# Patient Record
Sex: Male | Born: 1960 | Race: White | Hispanic: No | Marital: Married | Smoking: Former smoker
Health system: Southern US, Community
[De-identification: ages and names within clinical notes are randomized; demographics above are authoritative.]

## PROBLEM LIST (undated history)

## (undated) DIAGNOSIS — N200 Calculus of kidney: Secondary | ICD-10-CM

## (undated) DIAGNOSIS — C801 Malignant (primary) neoplasm, unspecified: Secondary | ICD-10-CM

---

## 2011-03-31 ENCOUNTER — Emergency Department: Payer: Self-pay | Admitting: Emergency Medicine

## 2011-04-10 ENCOUNTER — Ambulatory Visit: Payer: Self-pay | Admitting: Urology

## 2012-02-08 ENCOUNTER — Emergency Department: Payer: Self-pay | Admitting: Emergency Medicine

## 2012-02-08 LAB — URINALYSIS, COMPLETE
Bacteria: NONE SEEN
Bilirubin,UR: NEGATIVE
Leukocyte Esterase: NEGATIVE
Nitrite: NEGATIVE
Ph: 7 (ref 4.5–8.0)
Protein: 30
RBC,UR: 1389 /HPF (ref 0–5)

## 2012-02-08 LAB — CBC
HCT: 45.6 % (ref 40.0–52.0)
HGB: 16 g/dL (ref 13.0–18.0)
MCH: 31.8 pg (ref 26.0–34.0)
MCHC: 35.1 g/dL (ref 32.0–36.0)
MCV: 91 fL (ref 80–100)
Platelet: 244 10*3/uL (ref 150–440)
RDW: 13.9 % (ref 11.5–14.5)
WBC: 10 10*3/uL (ref 3.8–10.6)

## 2012-02-08 LAB — COMPREHENSIVE METABOLIC PANEL
Alkaline Phosphatase: 61 U/L (ref 50–136)
Anion Gap: 8 (ref 7–16)
BUN: 16 mg/dL (ref 7–18)
Calcium, Total: 8.7 mg/dL (ref 8.5–10.1)
Co2: 27 mmol/L (ref 21–32)
Creatinine: 1.13 mg/dL (ref 0.60–1.30)
EGFR (African American): >60
SGOT(AST): 26 U/L (ref 15–37)
SGPT (ALT): 36 U/L (ref 12–78)
Total Protein: 7.1 g/dL (ref 6.4–8.2)

## 2012-03-01 ENCOUNTER — Ambulatory Visit: Payer: Self-pay | Admitting: Urology

## 2012-06-23 ENCOUNTER — Ambulatory Visit: Payer: Self-pay | Admitting: Urology

## 2013-05-23 ENCOUNTER — Ambulatory Visit: Payer: Self-pay | Admitting: Family Medicine

## 2015-02-09 ENCOUNTER — Ambulatory Visit
Admission: RE | Admit: 2015-02-09 | Discharge: 2015-02-09 | Disposition: A | Discharge status: Home / Self Care | Payer: Disability Insurance | Source: Ambulatory Visit | Attending: Physical Medicine and Rehabilitation | Admitting: Physical Medicine and Rehabilitation

## 2015-02-09 ENCOUNTER — Other Ambulatory Visit: Payer: Self-pay | Admitting: Physical Medicine and Rehabilitation

## 2015-02-09 DIAGNOSIS — M549 Dorsalgia, unspecified: Secondary | ICD-10-CM

## 2015-02-09 DIAGNOSIS — M545 Low back pain: Secondary | ICD-10-CM | POA: Diagnosis not present

## 2015-04-05 ENCOUNTER — Emergency Department: Payer: BC Managed Care – PPO

## 2015-04-05 ENCOUNTER — Emergency Department
Admission: EM | Admit: 2015-04-05 | Discharge: 2015-04-05 | Disposition: A | Discharge status: Home / Self Care | Payer: BC Managed Care – PPO | Attending: Emergency Medicine | Admitting: Emergency Medicine

## 2015-04-05 DIAGNOSIS — N201 Calculus of ureter: Secondary | ICD-10-CM

## 2015-04-05 DIAGNOSIS — R109 Unspecified abdominal pain: Secondary | ICD-10-CM | POA: Diagnosis present

## 2015-04-05 DIAGNOSIS — Z87891 Personal history of nicotine dependence: Secondary | ICD-10-CM | POA: Insufficient documentation

## 2015-04-05 HISTORY — DX: Calculus of kidney: N20.0

## 2015-04-05 HISTORY — DX: Malignant (primary) neoplasm, unspecified: C80.1

## 2015-04-05 LAB — COMPREHENSIVE METABOLIC PANEL
ALK PHOS: 52 U/L (ref 38–126)
ALT: 26 U/L (ref 17–63)
AST: 29 U/L (ref 15–41)
Albumin: 4.3 g/dL (ref 3.5–5.0)
Anion gap: 8 (ref 5–15)
BILIRUBIN TOTAL: 0.6 mg/dL (ref 0.3–1.2)
BUN: 22 mg/dL — AB (ref 6–20)
CALCIUM: 9 mg/dL (ref 8.9–10.3)
CO2: 26 mmol/L (ref 22–32)
CREATININE: 1.08 mg/dL (ref 0.61–1.24)
Chloride: 106 mmol/L (ref 101–111)
GFR calc Af Amer: >60 mL/min (ref >60)
Glucose, Bld: 111 mg/dL — ABNORMAL HIGH (ref 65–99)
POTASSIUM: 4.1 mmol/L (ref 3.5–5.1)
Sodium: 140 mmol/L (ref 135–145)
TOTAL PROTEIN: 6.7 g/dL (ref 6.5–8.1)

## 2015-04-05 LAB — URINALYSIS COMPLETE WITH MICROSCOPIC (ARMC ONLY)
BILIRUBIN URINE: NEGATIVE
GLUCOSE, UA: NEGATIVE mg/dL
LEUKOCYTES UA: NEGATIVE
Nitrite: NEGATIVE
PH: 5 (ref 5.0–8.0)
Protein, ur: 100 mg/dL — AB
Specific Gravity, Urine: 1.025 (ref 1.005–1.030)
Squamous Epithelial / LPF: NONE SEEN
WBC, UA: NONE SEEN WBC/hpf (ref 0–5)

## 2015-04-05 LAB — CBC WITH DIFFERENTIAL/PLATELET
BASOS ABS: 0 10*3/uL (ref 0–0.1)
BASOS PCT: 0 %
EOS ABS: 0.1 10*3/uL (ref 0–0.7)
EOS PCT: 1 %
HCT: 44.4 % (ref 40.0–52.0)
Hemoglobin: 15.4 g/dL (ref 13.0–18.0)
Lymphocytes Relative: 11 %
Lymphs Abs: 0.9 10*3/uL — ABNORMAL LOW (ref 1.0–3.6)
MCH: 33.4 pg (ref 26.0–34.0)
MCHC: 34.7 g/dL (ref 32.0–36.0)
MCV: 96.3 fL (ref 80.0–100.0)
MONO ABS: 0.7 10*3/uL (ref 0.2–1.0)
Monocytes Relative: 9 %
Neutro Abs: 6.4 10*3/uL (ref 1.4–6.5)
Neutrophils Relative %: 79 %
PLATELETS: 152 10*3/uL (ref 150–440)
RBC: 4.61 MIL/uL (ref 4.40–5.90)
RDW: 13.6 % (ref 11.5–14.5)
WBC: 8.1 10*3/uL (ref 3.8–10.6)

## 2015-04-05 MED ORDER — KETOROLAC TROMETHAMINE 30 MG/ML IJ SOLN
30.0000 mg | Freq: Once | INTRAMUSCULAR | Status: AC
  Administered 2015-04-05: 30 mg via INTRAVENOUS
  Filled 2015-04-05: qty 1

## 2015-04-05 MED ORDER — TAMSULOSIN HCL 0.4 MG PO CAPS: 0.4000 mg | ORAL_CAPSULE | Freq: Every day | ORAL | Status: AC

## 2015-04-05 MED ORDER — SODIUM CHLORIDE 0.9 % IV BOLUS (SEPSIS)
1000.0000 mL | Freq: Once | INTRAVENOUS | Status: AC
  Administered 2015-04-05: 1000 mL via INTRAVENOUS

## 2015-04-05 MED ORDER — ONDANSETRON HCL 4 MG/2ML IJ SOLN
4.0000 mg | Freq: Once | INTRAMUSCULAR | Status: AC
  Administered 2015-04-05: 4 mg via INTRAVENOUS
  Filled 2015-04-05: qty 2

## 2015-04-05 MED ORDER — OXYCODONE-ACETAMINOPHEN 5-325 MG PO TABS: 1.0000 | ORAL_TABLET | Freq: Four times a day (QID) | ORAL | Status: AC | PRN

## 2015-04-05 MED ORDER — MORPHINE SULFATE (PF) 4 MG/ML IV SOLN
4.0000 mg | Freq: Once | INTRAVENOUS | Status: AC
  Administered 2015-04-05: 4 mg via INTRAVENOUS
  Filled 2015-04-05: qty 1

## 2015-04-05 NOTE — Discharge Instructions (Signed)

## 2015-04-05 NOTE — ED Notes (Signed)
Patient comes in complaining of left side flank pain that started at 3 pm today.  Patient reports blood in urine and has a history of kidney stones.

## 2015-04-05 NOTE — ED Provider Notes (Signed)
[facility] Emergency Department Provider Note  Time seen: 6:20 PM  I have reviewed the triage vital signs and the nursing notes.   HISTORY  Chief Complaint Flank Pain    HPI Marvin Mcdaniel is a 54 y.o. male with a past medical history of kidney stones, AML, presents the emergency department with left flank pain. According to the patient he had acute onset of left flank pain 3 hours prior to arrival. States earlier today he noted a little pain/stinging in his penis, he urinated noticed a little blood in his urine. He said the flank pain started shortly after and is continued. He has a history of 2 kidney stones in the past, the last of which was 3 years ago. The patient was followed by Dr. Jacqlyn Larsen. Denies any fever, denies any dysuria. Denies nausea, vomiting, diarrhea. Describes the pain as moderate, but occasionally will become severe. No modifying factor identified.     Past Medical History  Diagnosis Date  . Kidney stone   . Cancer     There are no active problems to display for this patient.   History reviewed. No pertinent past surgical history.  No current outpatient prescriptions on file.  Allergies Meropenem and Sulfa antibiotics  History reviewed. No pertinent family history.  Social History Social History  Substance Use Topics  . Smoking status: Former Research scientist (life sciences)  . Smokeless tobacco: None  . Alcohol Use: Yes    Review of Systems Constitutional: Negative for fever. Cardiovascular: Negative for chest pain. Respiratory: Negative for shortness of breath. Gastrointestinal: Positive left flank pain. Negative for nausea, vomiting, diarrhea. Genitourinary: Negative for dysuria. Positive for hematuria. Musculoskeletal: Positive for left flank pain. Neurological: Negative for headache 10-point ROS otherwise negative.  ____________________________________________   PHYSICAL EXAM:  VITAL SIGNS: ED Triage Vitals  Enc Vitals Group     BP  04/05/15 1714 141/90 mmHg     Pulse Rate 04/05/15 1714 93     Resp 04/05/15 1714 16     Temp 04/05/15 1714 98.3 F (36.8 C)     Temp Source 04/05/15 1714 Oral     SpO2 04/05/15 1714 97 %     Weight 04/05/15 1714 208 lb (94.348 kg)     Height 04/05/15 1714 5\' 6"  (1.676 m)     Head Cir --      Peak Flow --      Pain Score --      Pain Loc --      Pain Edu? --      Excl. in [place]? --     Constitutional: Alert and oriented. Well appearing and in no distress. Eyes: Normal exam ENT   Mouth/Throat: Mucous membranes are moist. Cardiovascular: Normal rate, regular rhythm. No murmur Respiratory: Normal respiratory effort without tachypnea nor retractions. Breath sounds are clear and equal bilaterally. No wheezes/rales/rhonchi. Gastrointestinal: Soft and nontender. No distention.  There is no CVA tenderness. Musculoskeletal: Nontender with normal range of motion in all extremities.  Neurologic:  Normal speech and language. No gross focal neurologic deficits are appreciated. Speech is normal. Skin:  Skin is warm, dry and intact.  Psychiatric: Mood and affect are normal. Speech and behavior are normal. Patient exhibits appropriate insight and judgment.  ____________________________________________    RADIOLOGY  CT consistent with 2 mm left UVJ stone  ____________________________________________   INITIAL IMPRESSION / ASSESSMENT AND PLAN / ED COURSE  Pertinent labs & imaging results that were available during my care of the patient were reviewed by  me and considered in my medical decision making (see chart for details).  Patient with left flank pain 3 hours and hematuria. Highly suggestive of ureterolithiasis. Patient has had 2 kidney stones in the past, one of which required a lithotripsy. We'll obtain labs, CT renal scan, treat discomfort and IV hydrate well in the emergency department.  CT consistent with left 2 mm UVJ stone, labs otherwise within normal limits including normal  urinalysis aside red blood cells. We'll discharge patient home on Flomax, Percocet, urology follow-up. Patient seen Dr. Jacqlyn Larsen in the past and wishes to follow up with him. Discussed return precautions for worsening pain, fever, patient agreeable.  ____________________________________________   FINAL CLINICAL IMPRESSION(S) / ED DIAGNOSES  Left flank pain Ureterolithiasis  Harvest Dark, MD 04/05/15 2038

## 2015-04-07 LAB — URINE CULTURE: Culture: NO GROWTH

## 2020-07-12 ENCOUNTER — Telehealth: Payer: Self-pay | Admitting: *Deleted

## 2020-07-12 DIAGNOSIS — Z122 Encounter for screening for malignant neoplasm of respiratory organs: Secondary | ICD-10-CM

## 2020-07-12 DIAGNOSIS — Z87891 Personal history of nicotine dependence: Secondary | ICD-10-CM

## 2020-07-12 NOTE — Telephone Encounter (Signed)
Received referral for initial lung cancer screening scan. Contacted patient and obtained smoking history,(current, 44 pack year) as well as answering questions related to screening process. Patient denies signs of lung cancer such as weight loss or hemoptysis. Patient denies comorbidity that would prevent curative treatment if lung cancer were found. Patient is scheduled for shared decision making visit and CT scan on 08/07/20 at 2pm.  Patient insurance is [employer] [id number]

## 2020-08-07 ENCOUNTER — Inpatient Hospital Stay: Payer: BC Managed Care – PPO | Attending: Nurse Practitioner | Admitting: Nurse Practitioner

## 2020-08-07 ENCOUNTER — Other Ambulatory Visit: Payer: Self-pay

## 2020-08-07 ENCOUNTER — Ambulatory Visit
Admission: RE | Admit: 2020-08-07 | Discharge: 2020-08-07 | Disposition: A | Discharge status: Home / Self Care | Payer: BC Managed Care – PPO | Source: Ambulatory Visit | Attending: Nurse Practitioner | Admitting: Nurse Practitioner

## 2020-08-07 DIAGNOSIS — Z122 Encounter for screening for malignant neoplasm of respiratory organs: Secondary | ICD-10-CM | POA: Insufficient documentation

## 2020-08-07 DIAGNOSIS — Z87891 Personal history of nicotine dependence: Secondary | ICD-10-CM

## 2020-08-07 NOTE — Progress Notes (Signed)
Virtual Visit via Video Enabled Telemedicine Note   I connected with Marvin Mcdaniel on 08/07/20 at 2:00 PM EST by video enabled telemedicine visit and verified that I am speaking with the correct person using two identifiers.   I discussed the limitations, risks, security and privacy concerns of performing an evaluation and management service by telemedicine and the availability of in-person appointments. I also discussed with the patient that there may be a patient responsible charge related to this service. The patient expressed understanding and agreed to proceed.   Other persons participating in the visit and their role in the encounter: Burgess Estelle, RN- checking in patient & navigation  Patient's location: [place]  Provider's location: Clinic  Chief Complaint: Low Dose CT Screening  Patient agreed to evaluation by telemedicine to discuss shared decision making for consideration of low dose CT lung cancer screening.    In accordance with CMS guidelines, patient has met eligibility criteria including age, absence of signs or symptoms of lung cancer.  Social History   Tobacco Use  . Smoking status: Current Every Day Smoker    Packs/day: 1.00    Years: 44.00    Pack years: 44.00    Types: Cigarettes  . Smokeless tobacco: Not on file  Substance Use Topics  . Alcohol use: Yes     A shared decision-making session was conducted prior to the performance of CT scan. This includes one or more decision aids, includes benefits and harms of screening, follow-up diagnostic testing, over-diagnosis, false positive rate, and total radiation exposure.   Counseling on the importance of adherence to annual lung cancer LDCT screening, impact of co-morbidities, and ability or willingness to undergo diagnosis and treatment is imperative for compliance of the program.   Counseling on the importance of continued smoking cessation for former smokers; the importance of smoking cessation for current  smokers, and information about tobacco cessation interventions have been given to patient including [name] and [employer] [place] programs.   Written order for lung cancer screening with LDCT has been given to the patient and any and all questions have been answered to the best of my abilities.    Yearly follow up will be coordinated by Burgess Estelle, Thoracic Navigator.  I discussed the assessment and treatment plan with the patient. The patient was provided an opportunity to ask questions and all were answered. The patient agreed with the plan and demonstrated an understanding of the instructions.   The patient was advised to call back or seek an in-person evaluation if the symptoms worsen or if the condition fails to improve as anticipated.   I provided 15 minutes of face-to-face video visit time dedicated to the care of this patient on the date of this encounter to include pre-visit review of smoking history, face-to-face time with the patient, and post visit ordering of testing/documentation.   Beckey Rutter, DNP, AGNP-C [place] [facility] [phone] (clinic)

## 2020-08-09 ENCOUNTER — Telehealth: Payer: Self-pay | Admitting: *Deleted

## 2020-08-09 NOTE — Telephone Encounter (Signed)
Notified patient of LDCT lung cancer screening program results with recommendation for 12 month follow up imaging. Also notified of incidental findings noted below and is encouraged to discuss further with PCP who will receive a copy of this note and/or the CT report. Patient verbalizes understanding. Patient specifically knows to discuss further imaging for thyroid finding.   IMPRESSION: 1. Lung-RADS 2S, benign appearance or behavior. Continue annual screening with low-dose chest CT without contrast in 12 months. 2. The "S" modifier above refers to potentially clinically significant non lung cancer related findings. Asymmetry in the size of the lobes of the thyroid gland (right greater than left) without a dominant discrete nodule identified. This is nonspecific, but further evaluation with nonemergent thyroid ultrasound is recommended to better evaluate this finding. 3. Mild diffuse bronchial wall thickening with mild centrilobular and paraseptal emphysema; imaging findings suggestive of underlying COPD.

## 2020-08-15 NOTE — Telephone Encounter (Signed)
Marlowe Kays at PCP office contacted and verbally notified of thyroid finding from CT and recommendation. Will enter note and send to PCP.

## 2020-08-16 ENCOUNTER — Other Ambulatory Visit: Payer: Self-pay | Admitting: Physician Assistant

## 2020-08-16 DIAGNOSIS — E010 Iodine-deficiency related diffuse (endemic) goiter: Secondary | ICD-10-CM

## 2020-08-24 ENCOUNTER — Other Ambulatory Visit: Payer: Self-pay

## 2020-08-24 ENCOUNTER — Ambulatory Visit
Admission: RE | Admit: 2020-08-24 | Discharge: 2020-08-24 | Disposition: A | Discharge status: Home / Self Care | Payer: BC Managed Care – PPO | Source: Ambulatory Visit | Attending: Physician Assistant | Admitting: Physician Assistant

## 2020-08-24 DIAGNOSIS — E010 Iodine-deficiency related diffuse (endemic) goiter: Secondary | ICD-10-CM | POA: Insufficient documentation

## 2021-06-23 IMAGING — US US THYROID
1 series · 13 of 25 positions shown · non-contrast
Comparison: None.

CLINICAL DATA: Thyromegaly

EXAM:
THYROID ULTRASOUND
TECHNIQUE: Ultrasound examination of the thyroid gland and adjacent soft
tissues was performed.

[Series 1: us thyroid · 0.07mm/px · 13 of 52 slices shown]
[im 1/52]
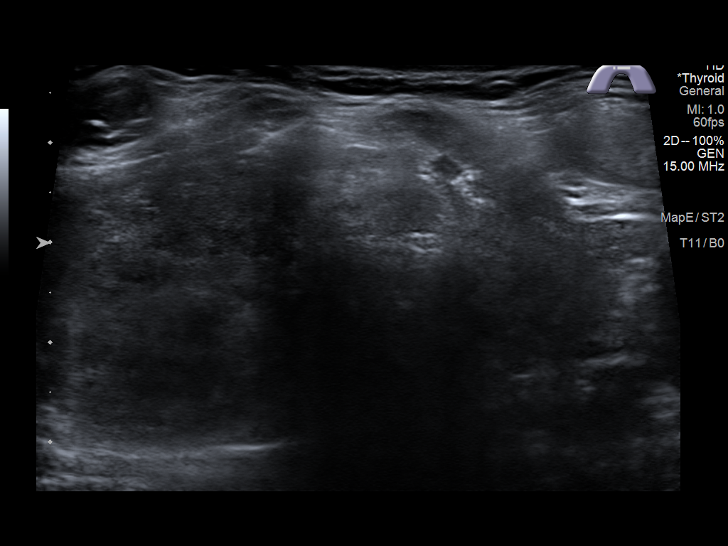
[im 5/52]
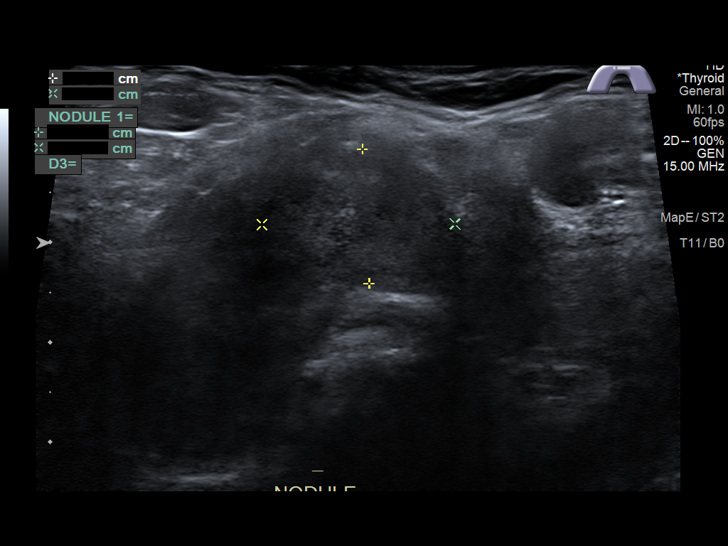
[im 9/52]
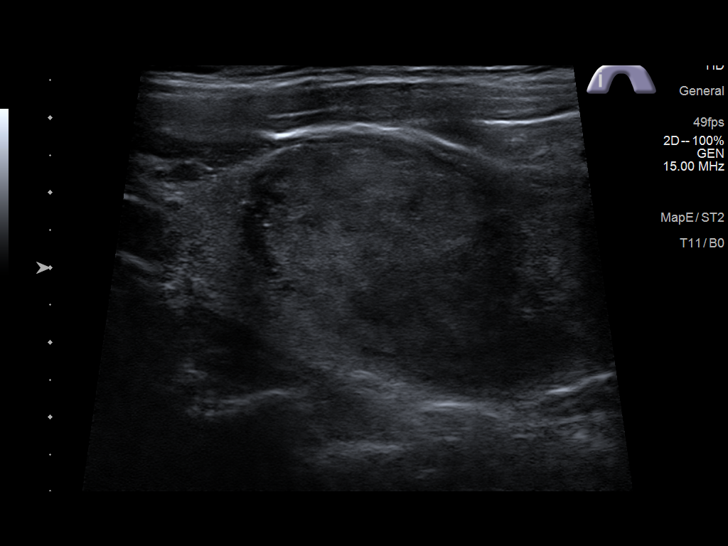
[im 13/52]
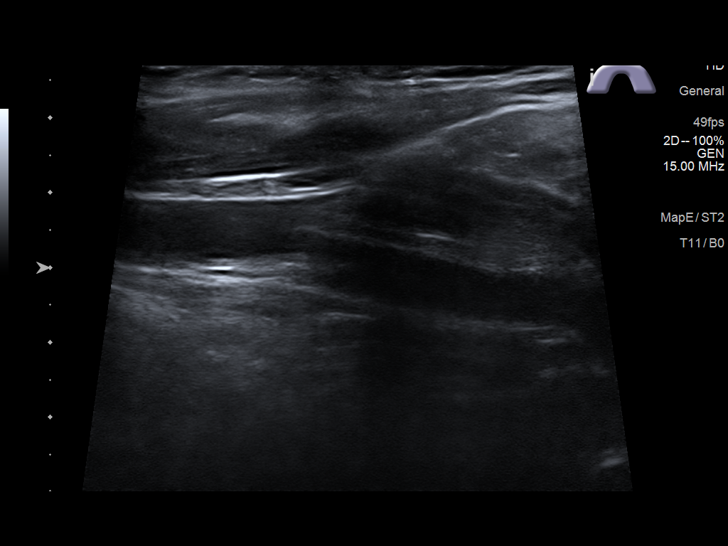
[im 18/52]
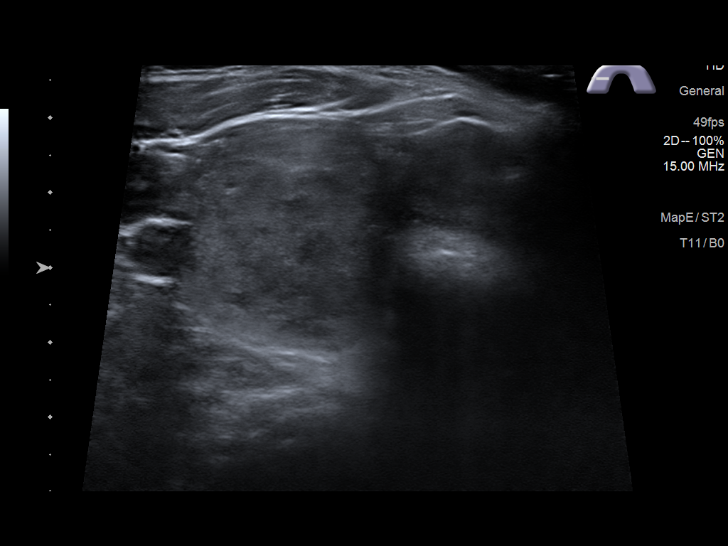
[im 22/52]
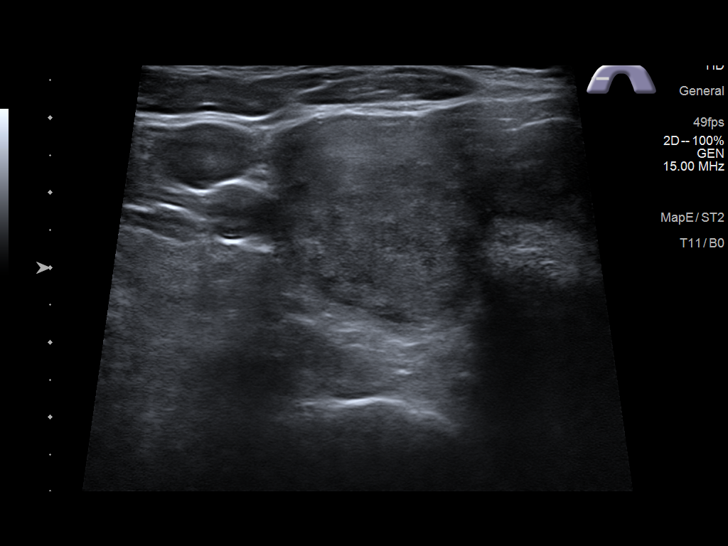
[im 26/52]
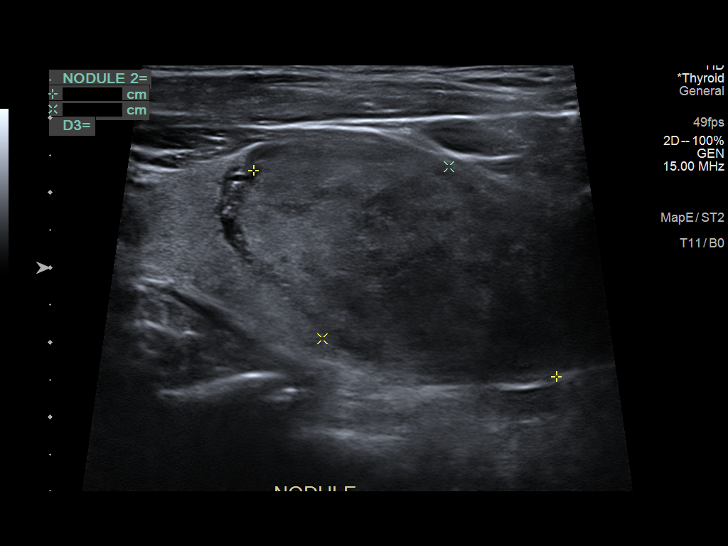
[im 30/52]
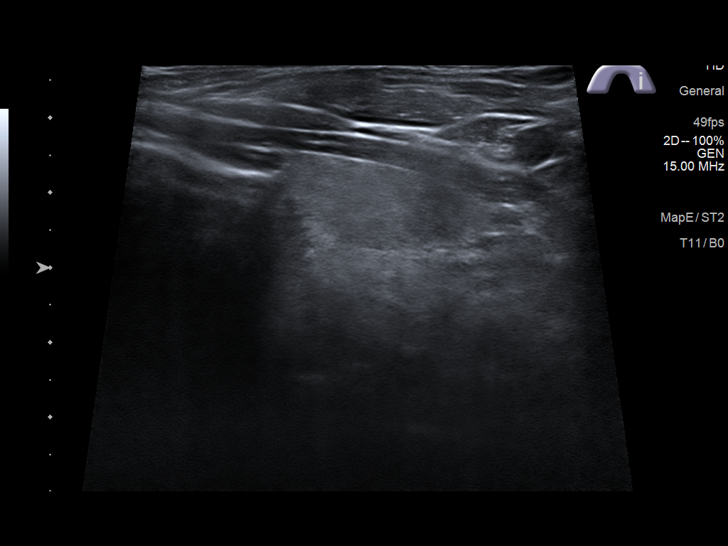
[im 35/52]
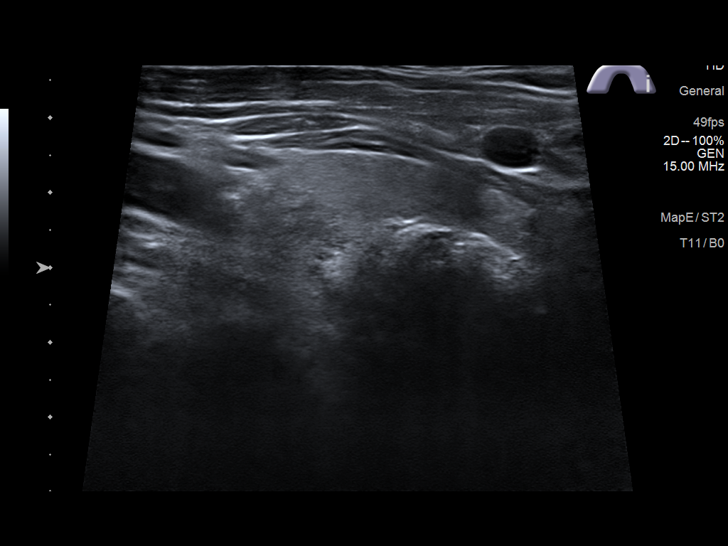
[im 39/52]
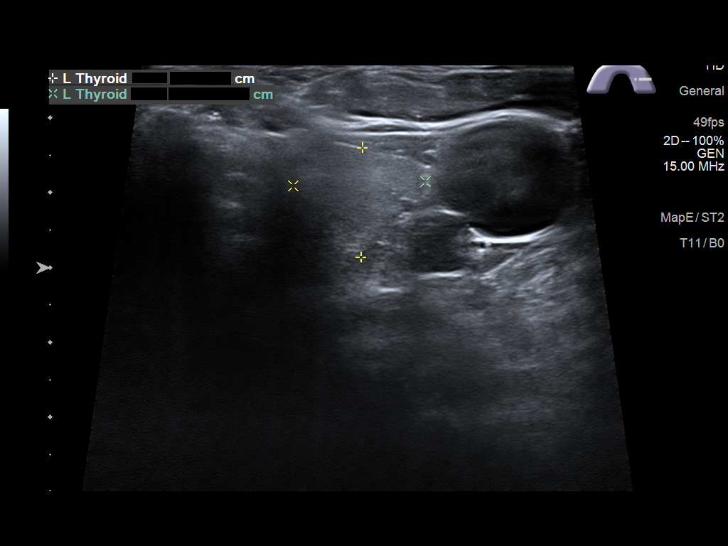
[im 43/52]
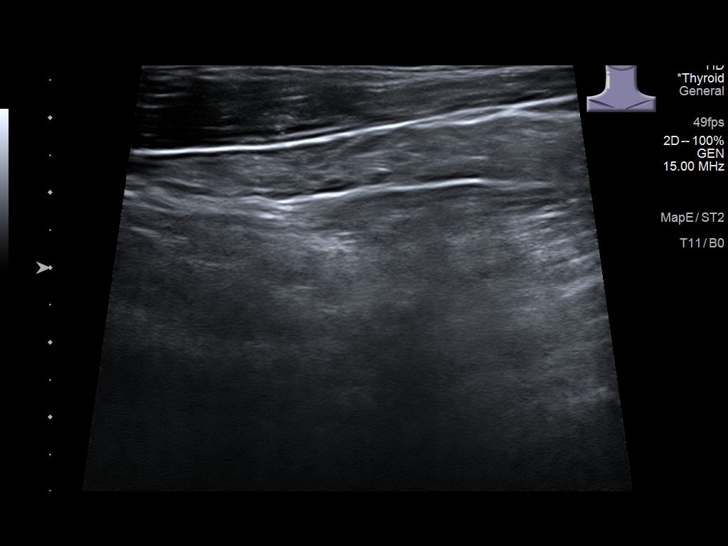
[im 47/52]
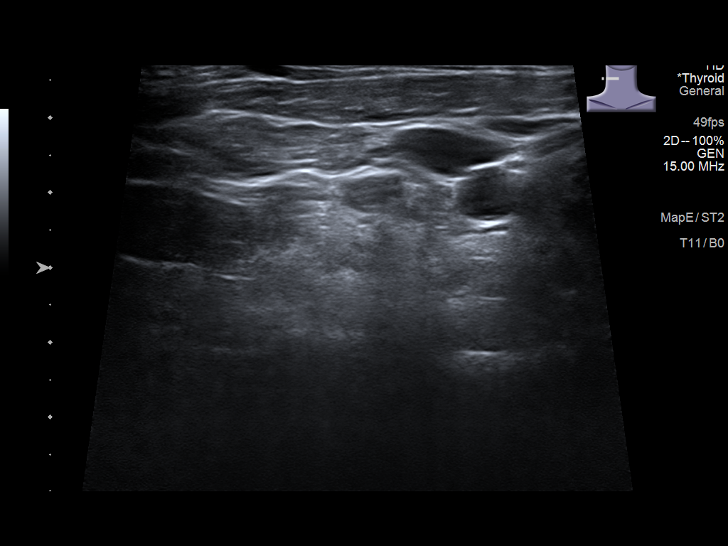
[im 52/52]
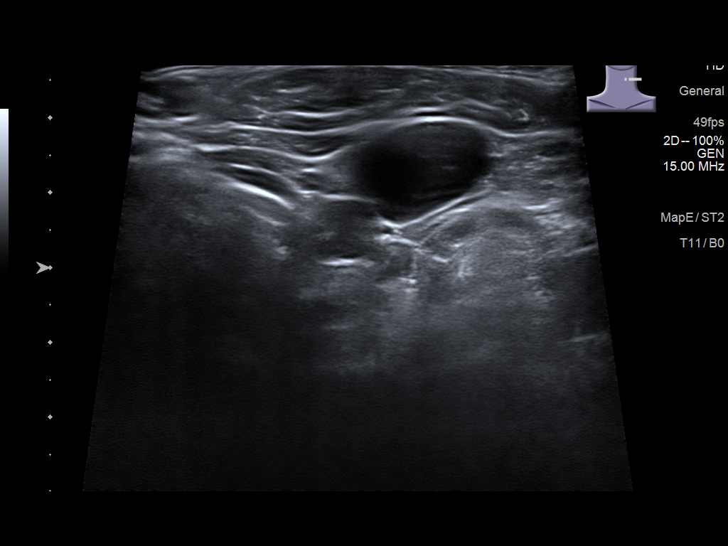

[13 of 25 positions shown; findings below may reference images not displayed]

FINDINGS: Parenchymal Echotexture: Moderately heterogenous

Isthmus: 0.8 cm thickness

Right lobe:   6.8 x 3.3 x 2.7 cm

Left lobe: 2.9 x 1.5 x 1.7 cm

_________________________________________________________

Estimated total number of nodules >/= 1 cm: 2

Number of spongiform nodules >/=  2 cm not described below (TR1): 0

Number of mixed cystic and solid nodules >/= 1.5 cm not described
below (TR2): 0

_________________________________________________________

Nodule # 1:

Location: Isthmus;

Maximum size: 3.6 cm; Other 2 dimensions: 1.3 x 1.9 cm

Composition: solid/almost completely solid (2)

Echogenicity: isoechoic (1)

Shape: not taller-than-wide (0)

Margins: ill-defined (0)

Echogenic foci: none (0)

ACR TI-RADS total points: 3.

ACR TI-RADS risk category: TR3 (3 points).

ACR TI-RADS recommendations:

**Given size (>/= 2.5 cm) and appearance, fine needle aspiration of
this mildly suspicious nodule should be considered based on TI-RADS
criteria.

_________________________________________________________

Nodule # 2:

Location: Right; Mid

Maximum size: 4.8 cm; Other 2 dimensions: 2.6 x 2.8 cm

Composition: solid/almost completely solid (2)

Echogenicity: hypoechoic (2)

Shape: not taller-than-wide (0)

Margins: smooth (0)

Echogenic foci: none (0)

ACR TI-RADS total points: 4.

ACR TI-RADS risk category: TR4 (4-6 points).

ACR TI-RADS recommendations:

**Given size (>/= 1.5 cm) and appearance, fine needle aspiration of
this moderately suspicious nodule should be considered based on
TI-RADS criteria.

_________________________________________________________

No regional cervical adenopathy identified.
IMPRESSION: Asymmetric thyromegaly secondary to isthmic and right nodules.
Recommend FNA biopsy of both lesions.

The above is in keeping with the ACR TI-RADS recommendations - [HOSPITAL] 9103;[DATE].

## 2021-10-10 ENCOUNTER — Telehealth: Payer: Self-pay | Admitting: Acute Care

## 2021-10-10 ENCOUNTER — Telehealth: Payer: Self-pay

## 2021-10-10 NOTE — Telephone Encounter (Signed)
Attempted to schedule annual LDCT, but could not reach pt-LVMM ?

## 2021-10-10 NOTE — Telephone Encounter (Signed)
error 

## 2022-01-09 ENCOUNTER — Other Ambulatory Visit: Payer: Self-pay | Admitting: *Deleted

## 2022-01-09 ENCOUNTER — Encounter: Payer: Self-pay | Admitting: *Deleted

## 2022-01-09 DIAGNOSIS — Z122 Encounter for screening for malignant neoplasm of respiratory organs: Secondary | ICD-10-CM

## 2022-01-09 DIAGNOSIS — Z87891 Personal history of nicotine dependence: Secondary | ICD-10-CM

## 2022-01-21 ENCOUNTER — Ambulatory Visit
Admission: RE | Admit: 2022-01-21 | Discharge: 2022-01-21 | Disposition: A | Discharge status: Home / Self Care | Payer: BC Managed Care – PPO | Source: Ambulatory Visit | Attending: Acute Care | Admitting: Acute Care

## 2022-01-21 DIAGNOSIS — Z87891 Personal history of nicotine dependence: Secondary | ICD-10-CM | POA: Diagnosis present

## 2022-01-21 DIAGNOSIS — Z122 Encounter for screening for malignant neoplasm of respiratory organs: Secondary | ICD-10-CM | POA: Diagnosis present

## 2022-01-24 ENCOUNTER — Other Ambulatory Visit: Payer: Self-pay

## 2022-01-24 DIAGNOSIS — Z87891 Personal history of nicotine dependence: Secondary | ICD-10-CM

## 2022-01-24 DIAGNOSIS — Z122 Encounter for screening for malignant neoplasm of respiratory organs: Secondary | ICD-10-CM

## 2023-01-23 ENCOUNTER — Ambulatory Visit
Admission: RE | Admit: 2023-01-23 | Discharge: 2023-01-23 | Disposition: A | Discharge status: Home / Self Care | Payer: BC Managed Care – PPO | Source: Ambulatory Visit | Attending: Acute Care | Admitting: Acute Care

## 2023-01-23 DIAGNOSIS — Z122 Encounter for screening for malignant neoplasm of respiratory organs: Secondary | ICD-10-CM | POA: Insufficient documentation

## 2023-01-23 DIAGNOSIS — Z87891 Personal history of nicotine dependence: Secondary | ICD-10-CM | POA: Diagnosis not present

## 2023-02-02 ENCOUNTER — Other Ambulatory Visit: Payer: Self-pay | Admitting: Acute Care

## 2023-02-02 DIAGNOSIS — Z122 Encounter for screening for malignant neoplasm of respiratory organs: Secondary | ICD-10-CM

## 2023-02-02 DIAGNOSIS — Z87891 Personal history of nicotine dependence: Secondary | ICD-10-CM

## 2023-08-14 ENCOUNTER — Ambulatory Visit: Payer: 59

## 2023-08-14 DIAGNOSIS — Z1211 Encounter for screening for malignant neoplasm of colon: Secondary | ICD-10-CM | POA: Diagnosis present

## 2023-08-14 DIAGNOSIS — K5730 Diverticulosis of large intestine without perforation or abscess without bleeding: Secondary | ICD-10-CM | POA: Diagnosis not present

## 2023-08-14 DIAGNOSIS — D128 Benign neoplasm of rectum: Secondary | ICD-10-CM | POA: Diagnosis not present

## 2024-02-02 ENCOUNTER — Ambulatory Visit
Admission: RE | Admit: 2024-02-02 | Discharge: 2024-02-02 | Disposition: A | Discharge status: Home / Self Care | Payer: Self-pay | Source: Ambulatory Visit | Attending: Acute Care | Admitting: Acute Care

## 2024-02-02 DIAGNOSIS — Z122 Encounter for screening for malignant neoplasm of respiratory organs: Secondary | ICD-10-CM | POA: Diagnosis present

## 2024-02-02 DIAGNOSIS — Z87891 Personal history of nicotine dependence: Secondary | ICD-10-CM | POA: Diagnosis present

## 2024-02-18 ENCOUNTER — Other Ambulatory Visit: Payer: Self-pay | Admitting: Acute Care

## 2024-02-18 DIAGNOSIS — Z122 Encounter for screening for malignant neoplasm of respiratory organs: Secondary | ICD-10-CM

## 2024-02-18 DIAGNOSIS — Z87891 Personal history of nicotine dependence: Secondary | ICD-10-CM
